# Patient Record
Sex: Female | Born: 1995 | Race: Black or African American | Hispanic: No | Marital: Single | Smoking: Never smoker
Health system: Southern US, Community
[De-identification: ages and names within clinical notes are randomized; demographics above are authoritative.]

---

## 2020-05-21 ENCOUNTER — Encounter (HOSPITAL_COMMUNITY): Payer: Self-pay

## 2020-05-21 ENCOUNTER — Other Ambulatory Visit: Payer: Self-pay

## 2020-05-21 ENCOUNTER — Emergency Department (HOSPITAL_COMMUNITY)
Admission: EM | Admit: 2020-05-21 | Discharge: 2020-05-21 | Disposition: A | Discharge status: Home / Self Care | Payer: No Typology Code available for payment source | Attending: Emergency Medicine | Admitting: Emergency Medicine

## 2020-05-21 DIAGNOSIS — L989 Disorder of the skin and subcutaneous tissue, unspecified: Secondary | ICD-10-CM | POA: Insufficient documentation

## 2020-05-21 DIAGNOSIS — R222 Localized swelling, mass and lump, trunk: Secondary | ICD-10-CM | POA: Diagnosis present

## 2020-05-21 LAB — POC URINE PREG, ED: Preg Test, Ur: NEGATIVE

## 2020-05-21 MED ORDER — DOXYCYCLINE HYCLATE 100 MG PO CAPS: 100.0000 mg | ORAL_CAPSULE | Freq: Two times a day (BID) | ORAL | 0 refills | Status: DC

## 2020-05-21 NOTE — Discharge Instructions (Addendum)
Inflammation surrounding possible site of insect bite, no drainable fluid collection, take antibiotics twice daily for the next week, apply warm compresses, avoid wearing bras or clothing that put pressure over this area.  Follow-up with primary care in 1 week if still not improving.  Your pregnancy test was negative today.  Please use phone number at the end of your paperwork today to help establish a new primary care doctor.

## 2020-05-21 NOTE — ED Provider Notes (Signed)
[facility]-EMERGENCY DEPT Provider Note   CSN: [id number] Arrival date & time: 05/21/20  1202     History Chief Complaint  Patient presents with  . Insect Bite    Holly Gomez is a 25 y.o. female.  Holly Gomez is a 25 y.o. female history of obesity, otherwise healthy, presents to the ED for evaluation of a spider bite.  Patient reports that about 1 year ago she thinks she got bit by something over the right lateral chest adjacent to her right breast, thought it was a spider but did not see a spider specifically.  Reports that ever since then she has had intermittent pain and swelling to the area and still feels like she sees a bite mark there that has never resolved. No fevers or chills, no rash. She has never had any treatment or been seen for this suspected insect bite, called her PCP from before she moved who directed her to the ED as she does not have a PCP locally. Also requesting pregnancy test, as she missed her cycle last month.        History reviewed. No pertinent past medical history.  There are no problems to display for this patient.   History reviewed. No pertinent surgical history.   OB History   No obstetric history on file.     History reviewed. No pertinent family history.  Social History   Tobacco Use  . Smoking status: Never Smoker  . Smokeless tobacco: Never Used    Home Medications Prior to Admission medications   Medication Sig Start Date End Date Taking? Authorizing Provider  doxycycline (VIBRAMYCIN) 100 MG capsule Take 1 capsule (100 mg total) by mouth 2 (two) times daily. One po bid x 7 days 05/21/20  Yes Dartha Lodge, PA-C    Allergies    Patient has no known allergies.  Review of Systems   Review of Systems  Constitutional: Negative for chills and fever.  Gastrointestinal: Negative for nausea and vomiting.  Skin: Positive for color change and wound. Negative for rash.  Neurological: Negative for  weakness and numbness.  All other systems reviewed and are negative.   Physical Exam Updated Vital Signs BP (!) 153/92 (BP Location: Left Arm)   Pulse 99   Temp 99 F (37.2 C) (Oral)   Resp 18   Ht 5\' 1"  (1.549 m)   Wt (!) 160.6 kg   LMP 03/27/2020   SpO2 99%   BMI 66.89 kg/m   Physical Exam Vitals and nursing note reviewed.  Constitutional:      General: She is not in acute distress.    Appearance: Normal appearance. She is well-developed. She is obese. She is not ill-appearing or diaphoretic.  HENT:     Head: Normocephalic and atraumatic.  Eyes:     General:        Right eye: No discharge.        Left eye: No discharge.  Cardiovascular:     Rate and Rhythm: Normal rate.  Pulmonary:     Effort: Pulmonary effort is normal. No respiratory distress.  Musculoskeletal:     Cervical back: Neck supple.  Skin:    General: Skin is warm and dry.     Findings: No rash.     Comments: Small circular lesion that is indurated on the right lateral chest wall adjacent to the right breast, no fluctuance, some darkening of the skin, with slight surrounding erythema  Neurological:  Mental Status: She is alert and oriented to person, place, and time.     Coordination: Coordination normal.  Psychiatric:        Mood and Affect: Mood normal.        Behavior: Behavior normal.     ED Results / Procedures / Treatments   Labs (all labs ordered are listed, but only abnormal results are displayed) Labs Reviewed  POC URINE PREG, ED    EKG None  Radiology No results found.  Procedures Procedures   Medications Ordered in ED Medications - No data to display  ED Course  I have reviewed the triage vital signs and the nursing notes.  Pertinent labs & imaging results that were available during my care of the patient were reviewed by me and considered in my medical decision making (see chart for details).    MDM Rules/Calculators/A&P                         25 year old female  presents with concern for infection over suspected spider bite over her right lateral chest wall that occurred 1 year ago and since then has intermittently had pain and swelling.  There is a small circular lesions that is indurated but no fluctuance or drainable fluid collection.  Slight darkening of the skin with some overlying erythema, tender to palpation.  While this may have been an insect bite initially suspected is getting recurrently inflamed from pressure from clothing, given overlying erythema will treat with course of doxycycline but do not feel this is amenable to I&D at this time.  Discussed warm compresses, avoiding clothing that puts pressure over this area and return precautions.  Patient also expressed concern for possible pregnancy as she missed her cycle last month, but pregnancy test is negative here in the ED today.   At this time there does not appear to be any evidence of an acute emergency medical condition and the patient appears stable for discharge with appropriate outpatient follow up.Diagnosis was discussed with patient who verbalizes understanding and is agreeable to discharge.  Final Clinical Impression(s) / ED Diagnoses Final diagnoses:  Skin lesion    Rx / DC Orders ED Discharge Orders         Ordered    doxycycline (VIBRAMYCIN) 100 MG capsule  2 times daily        05/21/20 1336           Dartha Lodge, [place] 05/21/20 1341    Milagros Loll, MD 05/22/20 0730

## 2020-05-21 NOTE — ED Triage Notes (Signed)
Spider bite x1 year ago under right breast. Pt states intermittent pain and swelling. Patient states called MD and referred to ER. Patient reports missed period last month and would also like pregnancy test

## 2020-09-23 ENCOUNTER — Institutional Professional Consult (permissible substitution): Payer: No Typology Code available for payment source | Admitting: Plastic Surgery

## 2020-10-01 ENCOUNTER — Ambulatory Visit: Payer: No Typology Code available for payment source | Admitting: Internal Medicine

## 2020-10-30 DIAGNOSIS — G4733 Obstructive sleep apnea (adult) (pediatric): Secondary | ICD-10-CM | POA: Insufficient documentation

## 2020-10-30 DIAGNOSIS — G932 Benign intracranial hypertension: Secondary | ICD-10-CM | POA: Insufficient documentation

## 2021-01-02 ENCOUNTER — Ambulatory Visit (HOSPITAL_COMMUNITY)
Admission: EM | Admit: 2021-01-02 | Discharge: 2021-01-02 | Disposition: A | Discharge status: Home / Self Care | Payer: No Typology Code available for payment source | Attending: Physician Assistant | Admitting: Physician Assistant

## 2021-01-02 ENCOUNTER — Other Ambulatory Visit: Payer: Self-pay

## 2021-01-02 ENCOUNTER — Ambulatory Visit (INDEPENDENT_AMBULATORY_CARE_PROVIDER_SITE_OTHER): Payer: No Typology Code available for payment source

## 2021-01-02 ENCOUNTER — Emergency Department (HOSPITAL_BASED_OUTPATIENT_CLINIC_OR_DEPARTMENT_OTHER): Payer: No Typology Code available for payment source

## 2021-01-02 ENCOUNTER — Emergency Department (HOSPITAL_BASED_OUTPATIENT_CLINIC_OR_DEPARTMENT_OTHER)
Admission: EM | Admit: 2021-01-02 | Discharge: 2021-01-02 | Disposition: A | Discharge status: Home / Self Care | Payer: No Typology Code available for payment source | Attending: Emergency Medicine | Admitting: Emergency Medicine

## 2021-01-02 ENCOUNTER — Encounter (HOSPITAL_BASED_OUTPATIENT_CLINIC_OR_DEPARTMENT_OTHER): Payer: Self-pay | Admitting: *Deleted

## 2021-01-02 ENCOUNTER — Encounter (HOSPITAL_COMMUNITY): Payer: Self-pay

## 2021-01-02 DIAGNOSIS — M542 Cervicalgia: Secondary | ICD-10-CM

## 2021-01-02 DIAGNOSIS — R200 Anesthesia of skin: Secondary | ICD-10-CM | POA: Diagnosis not present

## 2021-01-02 DIAGNOSIS — R202 Paresthesia of skin: Secondary | ICD-10-CM

## 2021-01-02 DIAGNOSIS — M546 Pain in thoracic spine: Secondary | ICD-10-CM

## 2021-01-02 DIAGNOSIS — Y92410 Unspecified street and highway as the place of occurrence of the external cause: Secondary | ICD-10-CM | POA: Diagnosis not present

## 2021-01-02 LAB — PREGNANCY, URINE: Preg Test, Ur: NEGATIVE

## 2021-01-02 MED ORDER — TIZANIDINE HCL 4 MG PO CAPS: 4.0000 mg | ORAL_CAPSULE | Freq: Two times a day (BID) | ORAL | 0 refills | Status: DC | PRN
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
## 2021-01-02 NOTE — Discharge Instructions (Addendum)
As we discussed, we need to arrange a CT scan of your neck.  Please follow-up with orthopedics today and if you are unable to see them you need to go to the emergency room.  If you have any increased numbness or tingling in your hand or increased pain you need to go directly to the emergency room.  I have called in a muscle relaxer to help with your pain but this can make you sleepy so not drive or drink alcohol while taking it.

## 2021-01-02 NOTE — ED Triage Notes (Signed)
Attempted to take BP 4 times.
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
## 2021-01-16 ENCOUNTER — Emergency Department (HOSPITAL_BASED_OUTPATIENT_CLINIC_OR_DEPARTMENT_OTHER)
Admission: EM | Admit: 2021-01-16 | Discharge: 2021-01-16 | Disposition: A | Discharge status: Home / Self Care | Payer: No Typology Code available for payment source | Attending: Emergency Medicine | Admitting: Emergency Medicine

## 2021-01-16 ENCOUNTER — Encounter (HOSPITAL_BASED_OUTPATIENT_CLINIC_OR_DEPARTMENT_OTHER): Payer: Self-pay | Admitting: Obstetrics and Gynecology

## 2021-01-16 ENCOUNTER — Other Ambulatory Visit: Payer: Self-pay

## 2021-01-16 DIAGNOSIS — J069 Acute upper respiratory infection, unspecified: Secondary | ICD-10-CM | POA: Insufficient documentation

## 2021-01-16 DIAGNOSIS — R519 Headache, unspecified: Secondary | ICD-10-CM | POA: Diagnosis present

## 2021-01-16 DIAGNOSIS — Z20822 Contact with and (suspected) exposure to covid-19: Secondary | ICD-10-CM | POA: Insufficient documentation

## 2021-01-16 LAB — RESP PANEL BY RT-PCR (FLU A&B, COVID) ARPGX2
Influenza A by PCR: NEGATIVE
Influenza B by PCR: NEGATIVE
SARS Coronavirus 2 by RT PCR: NEGATIVE

## 2021-01-16 NOTE — Discharge Instructions (Addendum)
Your swab was negative for COVID and flu today.  Ensure to maintain fluid intake.  You may take over-the-counter medications as needed for your symptoms.  If you do not have a primary care provider you may follow-up with the [facility] department as needed for your symptoms.  Return to the ED if you are experiencing increasing/worsening fever, inability to swallow fluids, or worsening symptoms.

## 2021-01-16 NOTE — ED Provider Notes (Signed)
[facility] EMERGENCY DEPT Provider Note   CSN: [id number] Arrival date & time: 01/16/21  1638     History Chief Complaint  Patient presents with   Headache   Fatigue    Holly Gomez is a 25 y.o. female with no significant past medical history who presents to the ED complaining of  who presents to the Emergency Department complaining of intermittent, gradual onset, frontal headache onset 4 days.  Patient denies sick contacts.  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  She has associated nasal congestion, productive cough, intermittent nausea, photophobia, and sore throat.  She has tried over-the-counter Father Maurie Boettcher, and emergency packets with no relief of her symptoms.  She denies chest pain, shortness of breath, fever, chills, vomiting, abdominal pain, vision changes, rhinorrhea, trouble swallowing.   The history is provided by the patient. No language interpreter was used.      History reviewed. No pertinent past medical history.  There are no problems to display for this patient.   History reviewed. No pertinent surgical history.   OB History     Gravida  0   Para  0   Term  0   Preterm  0   AB  0   Living  0      SAB  0   IAB  0   Ectopic  0   Multiple  0   Live Births  0           No family history on file.  Social History   Tobacco Use   Smoking status: Never   Smokeless tobacco: Never  Vaping Use   Vaping Use: Never used  Substance Use Topics   Alcohol use: Yes    Comment: Social   Drug use: Never    Home Medications Prior to Admission medications   Medication Sig Start Date End Date Taking? Authorizing Provider  tiZANidine (ZANAFLEX) 4 MG capsule Take 1 capsule (4 mg total) by mouth 2 (two) times daily as needed for muscle spasms. 01/02/21   Raspet, Noberto Retort, PA-C    Allergies    Patient has no known allergies.  Review of Systems   Review of Systems  Constitutional:   Negative for chills and fever.  HENT:  Positive for congestion and sore throat. Negative for rhinorrhea and trouble swallowing.   Respiratory:  Positive for cough (orange). Negative for shortness of breath.   Cardiovascular:  Negative for chest pain.  Gastrointestinal:  Positive for nausea (intermittent). Negative for vomiting.  Neurological:  Positive for headaches.  All other systems reviewed and are negative.  Physical Exam Updated Vital Signs BP 131/85    Pulse 87    Temp 98.6 F (37 C)    Resp 10    Ht 5\' 2"  (1.575 m)    Wt (!) 154.2 kg    LMP 01/03/2021 (Exact Date)    SpO2 100%    BMI 62.19 kg/m   Physical Exam Vitals and nursing note reviewed.  Constitutional:      General: She is not in acute distress.    Appearance: She is not diaphoretic.  HENT:     Head: Normocephalic and atraumatic.     Comments: Mild tenderness to palpation to bilateral maxillary sinuses.  No tenderness to palpation to frontal sinuses bilaterally.     Nose: Nose normal. No congestion or rhinorrhea.     Mouth/Throat:     Mouth: Mucous membranes are moist.  Pharynx: Oropharynx is clear. No oropharyngeal exudate.     Comments: Patent airway.  Uvula midline.  Moist mucous membranes. Eyes:     General: No scleral icterus.    Extraocular Movements: Extraocular movements intact.     Conjunctiva/sclera: Conjunctivae normal.     Pupils: Pupils are equal, round, and reactive to light.  Cardiovascular:     Rate and Rhythm: Normal rate and regular rhythm.     Pulses: Normal pulses.     Heart sounds: Normal heart sounds.  Pulmonary:     Effort: Pulmonary effort is normal. No respiratory distress.     Breath sounds: Normal breath sounds. No wheezing.  Abdominal:     General: Bowel sounds are normal.     Palpations: Abdomen is soft. There is no mass.     Tenderness: There is no abdominal tenderness. There is no guarding or rebound.  Musculoskeletal:        General: Normal range of motion.     Cervical  back: Normal range of motion and neck supple.     Comments: No C, T, L, S spinal tenderness to palpation.  No musculature tenderness to palpation of back.  No overlying skin changes.  Skin:    General: Skin is warm and dry.  Neurological:     Mental Status: She is alert.  Psychiatric:        Behavior: Behavior normal.    ED Results / Procedures / Treatments   Labs (all labs ordered are listed, but only abnormal results are displayed) Labs Reviewed  RESP PANEL BY RT-PCR (FLU A&B, COVID) [id number]    EKG None  Radiology No results found.  Procedures Procedures   Medications Ordered in ED Medications - No data to display  ED Course  I have reviewed the triage vital signs and the nursing notes.  Pertinent labs & imaging results that were available during my care of the patient were reviewed by me and considered in my medical decision making (see chart for details).  Clinical Course as of 01/16/21 1456  Fri Jan 16, 2021  1448 Patient evaluated prior to discharge, resting comfortably on stretcher.  Discussed discharge treatment plan with patient patient agreeable at this time. [SB]    Clinical Course User Index [SB] Lamark Schue A, PA-C   MDM Rules/Calculators/A&P                         Patient with gradual onset frontal headache, cough, sore throat x10 days.  Denies sick contacts. Differential diagnosis includes SAH, ICH, COVID, Flu, or viral URI with cough. On exam patient without acute cardiovascular, pulmonary, abdominal exam findings.  No focal neurological deficit on exam.  No vision changes.  No red flags of neck pain, neck stiffness, worst headache of life.  Offered patient treatment for headache in ED, patient declines treatment at this time.  Patient notes that she thinks she has a common cold and would like a note for work and that is her reason for being in the ED.  Swabbed for COVID, flu.  Swab negative for COVID and flu.  CT imaging not indicated at this time, no red  flag symptoms, no focal neurological deficits, no visual disturbances.  Presentation less likely due to [facility] or ICH due to absence of red flags.  Patient presentation suspicious for viral URI with cough.  Work note provided.  Supportive care measures and strict return precautions discussed with patient at bedside.  Patient acknowledges  and verbalized understanding.  Patient agreeable with discharge treatment plan.  Patient appears safe for discharge at this time.  Follow-up as indicated in discharge paperwork.  Final Clinical Impression(s) / ED Diagnoses Final diagnoses:  Viral URI with cough    Rx / DC Orders ED Discharge Orders     None        Helana Macbride A, PA-C 01/16/21 1633    Terald Sleeper, MD 01/17/21 [phone]

## 2021-01-16 NOTE — ED Triage Notes (Signed)
Patient reports Monday night she started feeling sick with fatigue, headaches, cough, and sore throat. Patient reports she has not been feeling better since then.

## 2021-11-10 ENCOUNTER — Other Ambulatory Visit: Payer: Self-pay

## 2021-11-10 ENCOUNTER — Emergency Department (HOSPITAL_BASED_OUTPATIENT_CLINIC_OR_DEPARTMENT_OTHER): Payer: Medicaid Other | Admitting: Radiology

## 2021-11-10 ENCOUNTER — Emergency Department (HOSPITAL_BASED_OUTPATIENT_CLINIC_OR_DEPARTMENT_OTHER)
Admission: EM | Admit: 2021-11-10 | Discharge: 2021-11-10 | Disposition: A | Discharge status: Home / Self Care | Payer: Self-pay | Attending: Emergency Medicine | Admitting: Emergency Medicine

## 2021-11-10 ENCOUNTER — Encounter (HOSPITAL_BASED_OUTPATIENT_CLINIC_OR_DEPARTMENT_OTHER): Payer: Self-pay | Admitting: Emergency Medicine

## 2021-11-10 DIAGNOSIS — X58XXXA Exposure to other specified factors, initial encounter: Secondary | ICD-10-CM | POA: Insufficient documentation

## 2021-11-10 DIAGNOSIS — S93492A Sprain of other ligament of left ankle, initial encounter: Secondary | ICD-10-CM | POA: Insufficient documentation

## 2021-11-10 MED ORDER — ACETAMINOPHEN 500 MG PO TABS
1000.0000 mg | ORAL_TABLET | Freq: Once | ORAL | Status: DC
  Filled 2021-11-10: qty 2

## 2021-11-10 MED ORDER — IBUPROFEN 800 MG PO TABS
800.0000 mg | ORAL_TABLET | Freq: Once | ORAL | Status: DC
  Filled 2021-11-10: qty 1

## 2021-11-10 NOTE — ED Provider Notes (Signed)
[facility] EMERGENCY DEPT Provider Note   CSN: [id number] Arrival date & time: 11/10/21  0759     History  Chief Complaint  Patient presents with   Ankle Pain    Holly Gomez is a 26 y.o. female.  26 yo F with a chief complaints of left ankle pain.  Patient states that she has been trying to walk more and to spend more time on her feet and developed some left lateral ankle pain.  She denies obvious trauma to the area.  Denies any pain to the knee.  Pain to the lateral aspect of the ankle.  Worse with bearing weight.   Ankle Pain      Home Medications Prior to Admission medications   Medication Sig Start Date End Date Taking? Authorizing Provider  tiZANidine (ZANAFLEX) 4 MG capsule Take 1 capsule (4 mg total) by mouth 2 (two) times daily as needed for muscle spasms. 01/02/21   Raspet, Derry Skill, PA-C      Allergies    Patient has no known allergies.    Review of Systems   Review of Systems  Physical Exam Updated Vital Signs BP (!) 138/94 (BP Location: Left Arm)   Pulse 87   Temp 98.6 F (37 C) (Oral)   Resp 20   Ht 5\' 2"  (1.575 m)   Wt (!) 154.2 kg   LMP 10/11/2021 (Exact Date)   SpO2 100%   BMI 62.18 kg/m  Physical Exam Vitals and nursing note reviewed.  Constitutional:      General: She is not in acute distress.    Appearance: She is well-developed. She is not diaphoretic.     Comments: BMI 62  HENT:     Head: Normocephalic and atraumatic.  Eyes:     Pupils: Pupils are equal, round, and reactive to light.  Cardiovascular:     Rate and Rhythm: Normal rate and regular rhythm.     Heart sounds: No murmur heard.    No friction rub. No gallop.  Pulmonary:     Effort: Pulmonary effort is normal.     Breath sounds: No wheezing or rales.  Abdominal:     General: There is no distension.     Palpations: Abdomen is soft.     Tenderness: There is no abdominal tenderness.  Musculoskeletal:        General: No tenderness.     Cervical  back: Normal range of motion and neck supple.  Skin:    General: Skin is warm and dry.  Neurological:     Mental Status: She is alert and oriented to person, place, and time.  Psychiatric:        Behavior: Behavior normal.     ED Results / Procedures / Treatments   Labs (all labs ordered are listed, but only abnormal results are displayed) Labs Reviewed - No data to display  EKG None  Radiology DG Ankle Complete Left  Result Date: 11/10/2021 CLINICAL DATA:  Pain x1 month EXAM: LEFT ANKLE COMPLETE - 3+ VIEW COMPARISON:  None Available. FINDINGS: No fracture or dislocation is seen. There are no focal lytic lesions. There are no radiopaque foreign bodies. IMPRESSION: No radiographic abnormalities are seen in the bony structures in left ankle. Electronically Signed   By: Elmer Picker M.D.   On: 11/10/2021 08:29    Procedures Procedures    Medications Ordered in ED Medications  acetaminophen (TYLENOL) tablet 1,000 mg (1,000 mg Oral Not Given 11/10/21 0824)  ibuprofen (ADVIL) tablet 800  mg (800 mg Oral Not Given 11/10/21 3903)    ED Course/ Medical Decision Making/ A&P                           Medical Decision Making Amount and/or Complexity of Data Reviewed Radiology: ordered.  Risk OTC drugs. Prescription drug management.   26 yo F with a chief complaints of left lateral ankle pain.  This has been going on for a couple days.  Atraumatic.  On exam patient has pain along the attachment of the anterior talofibular ligament to the fibula.  Suspect likely strain.  Will obtain a plain film.  Plain film of the left ankle independently interpreted by me without fracture or dislocation.  Placed in an ASO.  Crutches.  PCP follow-up.  8:35 AM:  I have discussed the diagnosis/risks/treatment options with the patient.  Evaluation and diagnostic testing in the emergency department does not suggest an emergent condition requiring admission or immediate intervention beyond what  has been performed at this time.  They will follow up with PCP. We also discussed returning to the ED immediately if new or worsening sx occur. We discussed the sx which are most concerning (e.g., sudden worsening pain, fever, inability to tolerate by mouth) that necessitate immediate return. Medications administered to the patient during their visit and any new prescriptions provided to the patient are listed below.  Medications given during this visit Medications  acetaminophen (TYLENOL) tablet 1,000 mg (1,000 mg Oral Not Given 11/10/21 0824)  ibuprofen (ADVIL) tablet 800 mg (800 mg Oral Not Given 11/10/21 0092)     The patient appears reasonably screen and/or stabilized for discharge and I doubt any other medical condition or other [facility] requiring further screening, evaluation, or treatment in the ED at this time prior to discharge.          Final Clinical Impression(s) / ED Diagnoses Final diagnoses:  Sprain of anterior talofibular ligament of left ankle, initial encounter    Rx / DC Orders ED Discharge Orders     None         Melene Plan, DO 11/10/21 [phone]

## 2021-11-10 NOTE — ED Triage Notes (Signed)
Pt via pov from home with left ankle pain x 1 month. Pt denies any injury or trauma; able to walk, but she limps and has pain. Pt alert & oriented, tearful during triage (apparently not related to ankle pain).

## 2021-11-10 NOTE — Discharge Instructions (Signed)
Follow up with your doctor in the office.  I provided information for you to call the office upstairs if you would like.  They do have a sports medicine background and could potentially help you with your ankle sprain acutely.  Try to keep your weight off of the ankle as best you can for the next week.  Take 4 over the counter ibuprofen tablets 3 times a day or 2 over-the-counter naproxen tablets twice a day for pain. Also take tylenol 1000mg (2 extra strength) four times a day.

## 2021-12-30 ENCOUNTER — Ambulatory Visit (HOSPITAL_BASED_OUTPATIENT_CLINIC_OR_DEPARTMENT_OTHER): Payer: Medicaid Other | Admitting: Family Medicine

## 2022-03-10 ENCOUNTER — Encounter: Payer: Self-pay | Admitting: Podiatry

## 2022-03-10 ENCOUNTER — Ambulatory Visit (INDEPENDENT_AMBULATORY_CARE_PROVIDER_SITE_OTHER): Payer: Medicaid Other

## 2022-03-10 ENCOUNTER — Other Ambulatory Visit: Payer: Self-pay | Admitting: Podiatry

## 2022-03-10 ENCOUNTER — Ambulatory Visit: Payer: Medicaid Other | Admitting: Podiatry

## 2022-03-10 DIAGNOSIS — M216X1 Other acquired deformities of right foot: Secondary | ICD-10-CM | POA: Diagnosis not present

## 2022-03-10 DIAGNOSIS — M216X2 Other acquired deformities of left foot: Secondary | ICD-10-CM

## 2022-03-10 DIAGNOSIS — M7752 Other enthesopathy of left foot: Secondary | ICD-10-CM

## 2022-03-10 DIAGNOSIS — M778 Other enthesopathies, not elsewhere classified: Secondary | ICD-10-CM

## 2022-03-10 DIAGNOSIS — M7751 Other enthesopathy of right foot: Secondary | ICD-10-CM

## 2022-03-10 MED ORDER — MELOXICAM 15 MG PO TABS: 15.0000 mg | ORAL_TABLET | Freq: Every day | ORAL | 3 refills | Status: DC

## 2022-03-10 MED ORDER — METHYLPREDNISOLONE 4 MG PO TBPK: ORAL_TABLET | ORAL | 0 refills | Status: AC

## 2022-03-10 MED ORDER — TRIAMCINOLONE ACETONIDE 40 MG/ML IJ SUSP
40.0000 mg | Freq: Once | INTRAMUSCULAR | Status: AC
  Administered 2022-03-10: 40 mg

## 2022-03-10 NOTE — Progress Notes (Signed)
  Subjective:  Patient ID: Holly Gomez, female    DOB: 1995/12/11,  MRN: [id number] HPI Chief Complaint  Patient presents with   Foot Pain    Lateral foot and ankle left/lateral foot right - aching x several months, no injury, tried Ibuprofen, elevating, worse last Oct-went to ER-xrayed, said sprained, gave brace-referred to sports med, but never went   New Patient (Initial Visit)    27 y.o. female presents with the above complaint.   ROS: Denies fever chills nausea vomit muscle aches pains calf pain back pain chest pain shortness of breath.  No past medical history on file. No past surgical history on file.  Current Outpatient Medications:    methylPREDNISolone (MEDROL DOSEPAK) 4 MG TBPK tablet, 6 day dose pack - take as directed, Disp: 21 tablet, Rfl: 0   Misc Natural Products (ELDERBERRY IMMUNE COMPLEX) CHEW, Chew by mouth., Disp: , Rfl:    Multiple Vitamin (MULTIVITAMIN) capsule, Take 1 capsule by mouth daily., Disp: , Rfl:   No Known Allergies Review of Systems Objective:  There were no vitals filed for this visit.  General: Well developed, nourished, in no acute distress, alert and oriented x3   Dermatological: Skin is warm, dry and supple bilateral. Nails x 10 are well maintained; remaining integument appears unremarkable at this time. There are no open sores, no preulcerative lesions, no rash or signs of infection present.  Vascular: Dorsalis Pedis artery and Posterior Tibial artery pedal pulses are 2/4 bilateral with immedate capillary fill time. Pedal hair growth present. No varicosities and no lower extremity edema present bilateral.   Neruologic: Grossly intact via light touch bilateral. Vibratory intact via tuning fork bilateral. Protective threshold with Semmes Wienstein monofilament intact to all pedal sites bilateral. Patellar and Achilles deep tendon reflexes 2+ bilateral. No Babinski or clonus noted bilateral.   Musculoskeletal: No gross boney pedal  deformities bilateral. No pain, crepitus, or limitation noted with foot and ankle range of motion bilateral. Muscular strength 5/5 in all groups tested bilateral.  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  She does have mild tenderness on palpation medial calcaneal tubercle.  Gait: Unassisted, Nonantalgic.    Radiographs:  Radiographs taken today demonstrate osseously mature individual with significant pes planovalgus and some early osteoarthritic changes.  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  Plan: We discussed appropriate shoe gear stretching exercise ice therapy sugar modifications.  Offered her injection bilaterally she declined.  Started her on methylprednisolone to be followed by meloxicam.  Discussed appropriate shoes and I will follow-up with her as needed.     Zahi Plaskett T. [place], [place]
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
